# Patient Record
Sex: Male | Born: 1990 | Race: Black or African American | Hispanic: No | Marital: Single | Smoking: Never smoker
Health system: Southern US, Community
[De-identification: ages and names within clinical notes are randomized; demographics above are authoritative.]

---
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
## 2012-11-03 ENCOUNTER — Inpatient Hospital Stay: Payer: Self-pay | Admitting: Psychiatry

## 2012-11-03 LAB — COMPREHENSIVE METABOLIC PANEL
Albumin: 4.2 g/dL (ref 3.4–5.0)
Anion Gap: 7 (ref 7–16)
BUN: 23 mg/dL — ABNORMAL HIGH (ref 7–18)
Bilirubin,Total: 0.2 mg/dL (ref 0.2–1.0)
Calcium, Total: 9.6 mg/dL (ref 8.5–10.1)
Co2: 26 mmol/L (ref 21–32)
Glucose: 76 mg/dL (ref 65–99)
SGOT(AST): 29 U/L (ref 15–37)
SGPT (ALT): 26 U/L (ref 12–78)
Sodium: 138 mmol/L (ref 136–145)

## 2012-11-03 LAB — CBC
HCT: 51.6 % (ref 40.0–52.0)
MCH: 26.7 pg (ref 26.0–34.0)
MCHC: 33.7 g/dL (ref 32.0–36.0)
MCV: 79 fL — ABNORMAL LOW (ref 80–100)
Platelet: 225 10*3/uL (ref 150–440)

## 2012-11-03 LAB — URINALYSIS, COMPLETE
Bilirubin,UR: NEGATIVE
Blood: NEGATIVE
Glucose,UR: NEGATIVE mg/dL (ref 0–75)
Leukocyte Esterase: NEGATIVE
Ph: 6 (ref 4.5–8.0)
RBC,UR: 1 /HPF (ref 0–5)
Specific Gravity: 1.03 (ref 1.003–1.030)
WBC UR: 1 /HPF (ref 0–5)

## 2012-11-03 LAB — DRUG SCREEN, URINE
Amphetamines, Ur Screen: NEGATIVE (ref ?–1000)
Barbiturates, Ur Screen: NEGATIVE (ref ?–200)
Benzodiazepine, Ur Scrn: NEGATIVE (ref ?–200)
Cannabinoid 50 Ng, Ur ~~LOC~~: NEGATIVE (ref ?–50)
MDMA (Ecstasy)Ur Screen: NEGATIVE (ref ?–500)
Opiate, Ur Screen: NEGATIVE (ref ?–300)
Tricyclic, Ur Screen: NEGATIVE (ref ?–1000)

## 2012-11-03 LAB — ETHANOL: Ethanol: <3 mg/dL

## 2012-11-04 LAB — BEHAVIORAL MEDICINE 1 PANEL
Albumin: 3.5 g/dL (ref 3.4–5.0)
Anion Gap: 3 — ABNORMAL LOW (ref 7–16)
Basophil %: 0.6 %
Eosinophil #: 0.2 10*3/uL (ref 0.0–0.7)
Glucose: 87 mg/dL (ref 65–99)
HCT: 48.9 % (ref 40.0–52.0)
Lymphocyte #: 2 10*3/uL (ref 1.0–3.6)
Lymphocyte %: 34.3 %
MCH: 26.6 pg (ref 26.0–34.0)
Monocyte #: 0.4 x10 3/mm (ref 0.2–1.0)
Potassium: 4.2 mmol/L (ref 3.5–5.1)
RDW: 13.9 % (ref 11.5–14.5)
SGOT(AST): 24 U/L (ref 15–37)
Sodium: 143 mmol/L (ref 136–145)
Thyroid Stimulating Horm: 0.44 u[IU]/mL — ABNORMAL LOW
WBC: 5.8 10*3/uL (ref 3.8–10.6)

## 2014-08-18 NOTE — Discharge Summary (Signed)
PATIENT NAME:  Ivan Norris, Cloud A MR#:  [id number] DATE OF BIRTH:  [birth year]  DATE OF ADMISSION:  11/03/2012 DATE OF DISCHARGE:    HISTORY OF PRESENT ILLNESS: [name] is a 24 year old African American male, who was referred to the hospital by his psychiatrist. He reported that he has been depressed for a long time and he had told his psychiatrist that he was thinking about getting a gun and shooting himself. He reported poor sleep with poor energy for many months. He also had reported seeing shadows out of the corners of his eyes. He denied any auditory or visual hallucinations on admission.   HOSPITAL COURSE: The patient was admitted on regular precautions. He was started on Prozac at 20 mg once daily to address his mood symptoms. The patient was initially very withdrawn and isolated, but he was compliant with his treatment. During the course of his hospital stay, the patient became more interactive with his peers and attended groups. He was compliant with all the unit activities and interacted appropriately with his peers. A counseling session was also held with his physician. The patient endorsed that he has been more socially isolative and realizes he needs to make an effort to be more social. He discussed his future plans and stated that he was obtaining better clarity about what he needs to do. By the 15th, the patient had significantly improved mood. He denied any suicidal or homicidal ideations. He was quite stable and ready for discharge by the 15th.   PAST MEDICAL HISTORY: The patient does not have any medical illnesses.   HISTORY OF MENTAL ILLNESS IN THE FAMILY: There is no history of mental illness.  SOCIAL HISTORY:  The patient works at a job at [employer] and lives with his mother, sister and brother.  LEGAL HISTORY: He denied.  MENTAL STATUS EXAM AT DISCHARGE: The patient presented with much improved affect. He was smiling. Speech was still soft, but normal in rate. Mood is improved. Affect  is smiling. Thinking was organized and logical. Denied any suicidal or homicidal ideations. He denied any auditory or visual hallucinations. He had fair insight and judgment.   DIAGNOSES: AXIS I: Major depressive disorder. AXIS II: None. AXIS III: None. AXIS IV: Social issues. AXIS V: GAF of 75.  DISPOSITION/PLAN: Continue Prozac at 20 mg once daily. Follow up with the psychiatrist in weekly therapy sessions. Encouraged to make efforts to be more social. He will be discharged to the care of his mother with whom he lives.  ____________________________ Patrick NorthHimabindu Jevon Shells, MD hr:aw D: 11/09/2012 10:40:18 ET T: 11/09/2012 10:49:53 ET JOB#: [id number]  cc: Patrick NorthHimabindu Vihaan Gloss, MD, <Dictator> Patrick NorthHIMABINDU Evalyse Stroope MD ELECTRONICALLY SIGNED 11/17/2012 10:45

## 2014-08-18 NOTE — Consult Note (Signed)
PATIENT NAME:  Ivan Norris, Ivan Norris MR#:  [id number] DATE OF BIRTH:  September 27, 1990  DATE OF CONSULTATION:  11/03/2012  CONSULTING PHYSICIAN:  Izola PriceFrances C. Jaclynn MajorGreason, MD  CHIEF COMPLAINT: "My MD suggested I need to go to the ER.  [facility], but somehow I ended up here."   HISTORY OF PRESENT ILLNESS: Ivan Norris reports that he has been depressed for about Norris year. When I ask him about that, he reports that he has low self-esteem, he is not confident, everything he tries he fails at. He goes on to say, "I'm an outsider, I don't connect well with others. The only friends I have are on Facebook, and they only last Norris month or two."   He reports that he is thinking about getting Norris gun and shooting himself.   He goes on to say that he does not do anything anymore with his friends or any activities that he once enjoyed. He reports variable sleep with nightmares. He has no energy. He reports that he cannot keep anything in his brain. He is thinking more specifically and frequently about buying Norris gun.   He talks about seeing shadows out of the corners of his eyes when he is at work. He has periods of time when he thinks, "My life should be over."   He reports that this morning when he awakened he was feeling more depressed than usual, and that is the reason that he called his psychiatrist.   On interview, he appears apathetic, distant, isolative. He has complaints of lost interest, sleep change and suicidality. He is anhedonic. He denies delusions and none are noted. He does complain of seeing shadows out of the corners of his eyes at times. He cannot explain further.  He does not recognize them nor does he know their shapes. He is very still. He denies anxiety or panic attacks. His behavior is apathetic. His consciousness is intact. and he is attentive and appears able to concentrate on the conversation. First treatment, 2 months ago outpatient treatment.  No previous hospitalization or history of  suicide attempts. He is treated by Dr. Derrek GuSloan  [place], whom he describes as "my regular, MD." He reports that Dr. Kathrynn RunningManning has diagnosed him with depression and ADD, and for Norris month or two he has been taking sertraline 100 mg p.o. daily and Strattera 25 mg t.i.d.  He reports that he has noted no difference. He denies any use of substances, and his urine drug screen is not complete. He denies any use of alcohol.  HISTORY OF MENTAL ILLNESS IN THE FAMILY: He denies.   SOCIAL HISTORY: Ivan Norris is Norris 24 year old who works at Norris job, [employer], and lives with his mother, sister and brother.   SOCIAL HISTORY: He reports no social life. He says he has no friends.   HISTORY OF PRIOR TRAUMA OR ABUSE: He denies.  LEGAL HISTORY:  He denies.   MENTAL STATUS EXAM: His speech is slowed but fluent, and he is able to describe things in detail for the most part if he is pressed. His mood is depressed. He also appears isolative. His affect is flat. His thought processes are linear, logical and goal directed. His thought content is positive for suicidal ideation and plan and intent. It is devoid of homicidal ideation, auditory or visual hallucinations or delusions. He is oriented x 4.  His attention is alert, awake and oriented. His concentration is fair.  His memory appears to be intact. Fund of knowledge is fair. His language is fair. His judgment is fair-to-poor and his insight is poor.   REVIEW OF SYSTEMS: Noncontributory.   DIAGNOSIS:  PRINCIPAL AND PRIMARY:  AXIS I:  Depressive disorder, not otherwise specified.   ADDITIONAL DIAGNOSES:   AXIS I:  Attention deficit disorder.     AXIS II: Deferred.  AXIS III: Deferred.   AXIS IV:  Severe--problems with primary support group, social environment.   AXIS V: 45.      TREATMENT PLAN: Admit to [facility].   ____________________________ Izola Price. Jaclynn Major, MD fcg:cb D: 11/03/2012 14:36:47  ET T: 11/03/2012 16:04:17 ET JOB#: [id number]  cc: Izola Price. Jaclynn Major, MD, <Dictator> Maryan Puls MD ELECTRONICALLY SIGNED 11/03/2012 16:42

## 2014-09-05 NOTE — H&P (Signed)
Psychiatric Admission Assessment Adult Patient Identification:  24 year-old male Date of Evaluation:  11/04/2012 Chief Complaint:  "depressed, life not worth living,was sent to ER by his MD" History of Present Illness (8 essential elements): Patient is a 24 yo AAM who reports that he has been depressed for about a year. Patient states he has not been enjoying his life, not enjoying any activities. Has not been sleeping well, has nightmares. Reports being frustrated at his life, feels he fails at everything, does not have any friends. Difficuklty doing things, requiring increased effort, gets tired easily.reports that he is thinking about getting a gun and shooting himself.  reports that this morning when he awakened he was feeling more depressed than usual, and that is the reason that he called his psychiatrist.  visual hallucinations of seeing shadows at the corner of his eyes.He denies anxiety or panic attacks. His behavior is apathetic. First treatment, 2 months ago outpatient treatment.  No previous hospitalization or history of suicide attempts. He is treated by Dr. Derrek GuSloan  Manning [place]. He reports that Dr. Kathrynn RunningManning has diagnosed him with depression and ADD, and for a month or two he has been taking sertraline 100 mg p.o. daily and Strattera 25 mg t.i.d.  He reports that he has noted no difference. He denies any use of substances, and his urine drug screen is negative for any substances.. He denies any use of alcohol.   Associated Signs/SymptomsSymptoms:  depressed mood, suicidal thoughts, low energy, guilt (Hypo) Manic Symptoms:  poor sleep Anxiety Symptoms:  increased anxiety Psychotic Symptoms: visual hallucinations PTSD Symptoms:  Denies  Psychiatric Specialty Exam: Physical Exam:  Completed in ED, reviewed, stable  ROS:   CONSTITUTIONAL: No weight loss, fever, chills, weakness or fatigue. HEENT: Eyes: No diplopia or blurred vision. ENT: No earache, sore throat or  runny nose.  CARDIOVASCULAR: No pressure, squeezing, strangling, tightness, heaviness or aching about the chest, neck, axilla or epigastrium.  RESPIRATORY: No cough, shortness of breath, dyspnea or orthopnea.  GASTROINTESTINAL: No nausea, vomiting or diarrhea.  GENITOURINARY: No dysuria, frequency or urgency.  MUSCULOSKELETAL: No muscle or joint pain, stiffness.  SKIN: No change in skin, hair or nails.  NEURO: No paresthesias, fasciculations, seizures or weakness.  PSYCHIATRIC: depressed mood ENDOCRINE: No heat or cold intolerance, polyuria or polydipsia.  HEMATOLOGICAL: No easy bruising or bleeding.   Vital Signs:  Blood pressure 131/81, pulse 85, temperature 98.3 degrees F oral, respiratory rate 18  General Appearance:  Neat  Eye Contact:  Fair  Speech:  Normal rate  Volume:  Normal  Mood:  Agitated  Affect:  Flat  Thought Process:  organized  Orientation:  Alert and oriented x 3  Thought Content:  Depressed, negative  Suicidal Thoughts: feels like he is better of dead  Homicidal Thoughts:  Denies  Memory:  fair  Judgment:  fair  Insight:  shallow  Psychomotor Activity:  normal  Concentration: normal  Recall:  normal  Akathisia:  None  Handed:  Right  AIMS (if indicated):    Assets:  Resilience, physical health, social support  Sleep:  0 hours   Past Psychiatric History: Diagnosis:  MDD    Outpatient Care:  Dr.Manning  Substance Abuse Care:  None  Self-Mutilation:  None  Suicidal Attempts:  Denies  Violent Behaviors:  Denies   Past Medical History:  None (denies)  Past Medical (concussions, head injury, cardiac issues with apnea episode):  None  Allergies:  None  PTA Medications:  None  Previous Psychotropic Medications:  None  Medication/Dose:  None           Substance Abuse History in the last 12 months:  denies  Consequences of Substance Abuse:  None  Social History:  Lives with mother, sister and brother, employed Additional Social  History: Marital Status:  single Education:  high school History of Abuse (Emotional/Physical/Sexual):  Denies Hotel managerMilitary History:  None  Family History:  Denies  No results found for this or any previous visit (from the past 72 hours).Evaluations Assessment:I:  MDD with psychotic symptomsII:  DeferredIII:  Denies AXIS IV: severe depression AXIS V:  35  Treatment Plan/Recommendations:  Review of chart, vital signs, medications, and notes. 1. Admit for crisis management and stabilization; estimated length of stay 5-7 days.Individual and group therapy encouraged.Medication management for current depression.Coping skills for depression.Continue crisis stabilization and management.Address health issues:  7. Treatment plan in progress to prevent relapse of depression and anxiety.Psychosocial education regarding relapse prevention and self-care.Health care follow-up for any health concerns that may arise.Call for consult with hospitalist for addtional specialty patient services as needed.Patient able to contract for safety in the hospital.  Current Medications:  none, was taking Zoloft 100mg , strattera 25mg  tid, not helpful  Observation Level/Precautions:  Every 15 minute checks  Laboratory:  Completed and reviewed, stable  Psychotherapy:  Individual and group therapy  Medications:  Management  Consultations:  None  Discharge Concerns:  safety and stabilization  Estimated LOS:  5-7 days  Other:  certify that inpatient services furnished can reasonably be expected to improve the patient's condition.  Hima Aleja Yearwood,MD  Electronic Signatures: Patrick Northavi, Knute Mazzuca (MD)  (Signed on 10-Jul-14 13:58)  Authored  Last Updated: 10-Jul-14 13:58 by Patrick Northavi, Izaiah Tabb (MD)

## 2021-10-04 ENCOUNTER — Encounter (HOSPITAL_COMMUNITY): Payer: Self-pay | Admitting: Student

## 2021-10-04 ENCOUNTER — Ambulatory Visit (HOSPITAL_COMMUNITY)
Admission: EM | Admit: 2021-10-04 | Discharge: 2021-10-05 | Disposition: A | Discharge status: Home / Self Care | Payer: No Typology Code available for payment source | Attending: Psychiatry | Admitting: Psychiatry

## 2021-10-04 DIAGNOSIS — Z79899 Other long term (current) drug therapy: Secondary | ICD-10-CM | POA: Diagnosis not present

## 2021-10-04 DIAGNOSIS — Z20822 Contact with and (suspected) exposure to covid-19: Secondary | ICD-10-CM | POA: Diagnosis not present

## 2021-10-04 DIAGNOSIS — F332 Major depressive disorder, recurrent severe without psychotic features: Secondary | ICD-10-CM

## 2021-10-04 DIAGNOSIS — Z9151 Personal history of suicidal behavior: Secondary | ICD-10-CM | POA: Diagnosis not present

## 2021-10-04 LAB — COMPREHENSIVE METABOLIC PANEL
ALT: 19 U/L (ref 0–44)
AST: 33 U/L (ref 15–41)
Albumin: 4.5 g/dL (ref 3.5–5.0)
Alkaline Phosphatase: 135 U/L — ABNORMAL HIGH (ref 38–126)
Anion gap: 13 (ref 5–15)
BUN: 23 mg/dL — ABNORMAL HIGH (ref 6–20)
CO2: 22 mmol/L (ref 22–32)
Calcium: 9.5 mg/dL (ref 8.9–10.3)
Chloride: 102 mmol/L (ref 98–111)
Creatinine, Ser: 1.29 mg/dL — ABNORMAL HIGH (ref 0.61–1.24)
GFR, Estimated: >60 mL/min (ref >60)
Glucose, Bld: 58 mg/dL — ABNORMAL LOW (ref 70–99)
Potassium: 4.2 mmol/L (ref 3.5–5.1)
Sodium: 137 mmol/L (ref 135–145)
Total Bilirubin: 1.9 mg/dL — ABNORMAL HIGH (ref 0.3–1.2)
Total Protein: 8.1 g/dL (ref 6.5–8.1)

## 2021-10-04 LAB — HEMOGLOBIN A1C
Hgb A1c MFr Bld: 5.3 % (ref 4.8–5.6)
Mean Plasma Glucose: 105.41 mg/dL

## 2021-10-04 LAB — POCT URINE DRUG SCREEN - MANUAL ENTRY (I-SCREEN)
POC Amphetamine UR: NOT DETECTED
POC Buprenorphine (BUP): NOT DETECTED
POC Cocaine UR: NOT DETECTED
POC Marijuana UR: NOT DETECTED
POC Methadone UR: NOT DETECTED
POC Methamphetamine UR: NOT DETECTED
POC Morphine: NOT DETECTED
POC Oxazepam (BZO): NOT DETECTED
POC Oxycodone UR: NOT DETECTED
POC Secobarbital (BAR): NOT DETECTED

## 2021-10-04 LAB — CBC WITH DIFFERENTIAL/PLATELET
Abs Immature Granulocytes: 0.01 10*3/uL (ref 0.00–0.07)
Basophils Absolute: 0 10*3/uL (ref 0.0–0.1)
Basophils Relative: 0 %
Eosinophils Absolute: 0 10*3/uL (ref 0.0–0.5)
Eosinophils Relative: 0 %
HCT: 50.9 % (ref 39.0–52.0)
Hemoglobin: 16.4 g/dL (ref 13.0–17.0)
Immature Granulocytes: 0 %
Lymphocytes Relative: 32 %
Lymphs Abs: 1.9 10*3/uL (ref 0.7–4.0)
MCH: 26 pg (ref 26.0–34.0)
MCHC: 32.2 g/dL (ref 30.0–36.0)
MCV: 80.8 fL (ref 80.0–100.0)
Monocytes Absolute: 0.3 10*3/uL (ref 0.1–1.0)
Monocytes Relative: 6 %
Neutro Abs: 3.7 10*3/uL (ref 1.7–7.7)
Neutrophils Relative %: 62 %
Platelets: 254 10*3/uL (ref 150–400)
RBC: 6.3 MIL/uL — ABNORMAL HIGH (ref 4.22–5.81)
RDW: 13.7 % (ref 11.5–15.5)
WBC: 5.9 10*3/uL (ref 4.0–10.5)
nRBC: 0 % (ref 0.0–0.2)

## 2021-10-04 LAB — RESP PANEL BY RT-PCR (FLU A&B, COVID) ARPGX2
Influenza A by PCR: NEGATIVE
Influenza B by PCR: NEGATIVE
SARS Coronavirus 2 by RT PCR: NEGATIVE

## 2021-10-04 LAB — TSH: TSH: 0.402 u[IU]/mL (ref 0.350–4.500)

## 2021-10-04 LAB — URINALYSIS, ROUTINE W REFLEX MICROSCOPIC
Bilirubin Urine: NEGATIVE
Glucose, UA: NEGATIVE mg/dL
Hgb urine dipstick: NEGATIVE
Ketones, ur: 80 mg/dL — AB
Leukocytes,Ua: NEGATIVE
Nitrite: NEGATIVE
Protein, ur: NEGATIVE mg/dL
Specific Gravity, Urine: 1.033 — ABNORMAL HIGH (ref 1.005–1.030)
pH: 5 (ref 5.0–8.0)

## 2021-10-04 LAB — POC SARS CORONAVIRUS 2 AG: SARSCOV2ONAVIRUS 2 AG: NEGATIVE

## 2021-10-04 MED ORDER — TRAZODONE HCL 50 MG PO TABS: 50.0000 mg | ORAL_TABLET | Freq: Every evening | ORAL | Status: DC | PRN

## 2021-10-04 MED ORDER — ACETAMINOPHEN 325 MG PO TABS: 650.0000 mg | ORAL_TABLET | Freq: Four times a day (QID) | ORAL | Status: DC | PRN

## 2021-10-04 MED ORDER — ALUM & MAG HYDROXIDE-SIMETH 200-200-20 MG/5ML PO SUSP: 30.0000 mL | ORAL | Status: DC | PRN

## 2021-10-04 MED ORDER — FLUOXETINE HCL 10 MG PO CAPS
10.0000 mg | ORAL_CAPSULE | Freq: Every day | ORAL | Status: DC
  Administered 2021-10-04 – 2021-10-05 (×2): 10 mg via ORAL
  Filled 2021-10-04 (×3): qty 1

## 2021-10-04 MED ORDER — MAGNESIUM HYDROXIDE 400 MG/5ML PO SUSP: 30.0000 mL | Freq: Every day | ORAL | Status: DC | PRN

## 2021-10-04 NOTE — ED Notes (Signed)
Pt laying in bed calm and cooperative. No c/o of SI/HI or pain. Will continue to monitor for safety

## 2021-10-04 NOTE — ED Notes (Signed)
Pt sleeping@this time. Breathing even and unlabored. Will continue to monitor for safety 

## 2021-10-04 NOTE — Progress Notes (Signed)
Received Ivan Norris on the OBS unit and assisted with the skin assessment. He was oriented to his new environment and offered nourishments. He is pleasant and denied feeling suicidal, anxious and depressed at the present time. He stated his goal was to start taking medications and follow up therapy. He is currently watch a comedy on the TV.

## 2021-10-04 NOTE — Progress Notes (Signed)
   10/04/21 1020  [facility] Triage Screening (Walk-ins at [facility] only)  How Did You Hear About [place]? Family/Friend  What Is the Reason for Your Visit/Call Today? 31 year old present to [facility] with depressive symptoms. Report dealing with chronic depression past 10 years. Current depressive symptoms lack of motivation, low energy, lack of focus, moving slowly, lack of socialization skills, sleep varies (report overslept on the weekends) and lack appetite (report did not anything yesterday just drank coffee and water). Report lack of motivation is affecting is work (not making good sound decision at work) and lack of focus. Report suicidal ideations with no plan (last SI this morning). Suicidal intent age 66, denied homicidal, denied auditory/visual hallucinations. Denied substance use, report tired an elibigible for the 1st time last week. Denied bein on medication and denied therapy.  How Long Has This Been Causing You Problems? 1 wk - 1 month  Have You Recently Had Any Thoughts About Hurting Yourself? No  Are You Planning to Commit Suicide/Harm Yourself At This time? No  Have you Recently Had Thoughts About Hurting Someone Karolee Ohs? No  Are You Planning To Harm Someone At This Time? No  Are you currently experiencing any auditory, visual or other hallucinations? No  Have You Used Any Alcohol or Drugs in the Past 24 Hours? No  Do you have any current medical co-morbidities that require immediate attention? No  Clinician description of patient physical appearance/behavior: appropriate for weather  What Do You Feel Would Help You the Most Today? Stress Management  If access to [facility] was not available, would you have sought care in the Emergency Department? Yes  Determination of Need Routine (7 days)  Options For Referral Medication Management;Outpatient Therapy

## 2021-10-04 NOTE — ED Notes (Signed)
Meal provided 

## 2021-10-04 NOTE — ED Provider Notes (Signed)
[facility] Continuous Assessment Admission H&P  Date: 10/04/21 Patient Name: Ivan Norris MRN: [id number] Chief Complaint:  Chief Complaint  Patient presents with   Depression    31 year [facility]. Report dealing with chronic depression past 10 years. Current depressive symptoms lack of motivation, low energy, lack of focus, moving slowly, lack of socialization skills, sleep varies (report overslept on the weekends) and lack appetite (report did not anything yesterday just drank coffee and water). Report suicidal ideations with no plan (last SI this morning). Suicidal intent age 31, denied homicidal, denied auditory/visual hallucinations.       Diagnoses:  Final diagnoses:  Severe episode of recurrent major depressive disorder, without psychotic features (HCC)    HPI:  Ivan Norris is a 31 y.o. male [facility] SI after mom found out patient had decided not to go to work 10/04/2021 AM. Mom left work and [facility]. Stated that he's had increased frequency of active SI for a few weeks where he pictures himself cutting his wrist with a knife or shooting himself multiple times a day, with the last time this AM.  Depression had been worsening over past few weeks, with feeling of hopelessness, no motivation, decreased appetite, poor sleep, feeling like people and family would be better off if he were dead. Stressors are current job, anxiety about maintaining employment and finances since recent move in Feb 2023. Patient owns riffles x2 with ammunition for one in the home. [facility], mom had to convinced patient to give guns to grandma for keeping, but patient tried to put the guns away himself. Patient has had 1 suicide attempt and psych hospital admission about 7 years ago, attempt by cutting with new steak knife. Currently has no outpatient psych services or therapy or PCP, but has insurance now  from work.  Patient inially adamantly refused observation overnight until speaking with mom who convinced him to stay 1 night voluntarily. Patient gave consent to talk to mom, mainly to ask questions, but not disclose his SI. Patient is amenable to meds and php. Stating php soon is a good option because he was considering going back to trade school and finding a new job.   Collateral with mom in person:  Mom hardly sees patient due to conflicting work schedule, stating she pretty much only sees him through home camera. Otherwise, patient is isolated. Reported patient has been out of character and very depressed for last few weeks, similar to his last suicide attempt. Mom stated that she has provided multiple psych resources to patient and offered [address] suicide hotline, to which mom stated patient seems disinterested. Mom says patient is minimizing depression and is concern for his safety. Mom does not want to IVC patient for worry of it showing up on his record.   PHQ 2-9:     Total Time spent with patient: 1 hour  Musculoskeletal  Strength & Muscle Tone: within normal limits Gait & Station: normal Patient leans: N/A  Psychiatric Specialty Exam  Presentation General Appearance: Casual; Appropriate for Environment  Eye Contact:Minimal  Speech:Clear and Coherent; Normal Rate  Speech Volume:Normal  Handedness:Right   Mood and Affect  Mood:Depressed; Worthless; Hopeless  Affect:Restricted   Thought Process  Thought Processes:Coherent; Goal Directed  Descriptions of Associations:Circumstantial  Orientation:Full (Time, Place and Person)  Thought Content:Perseveration; Rumination (Ruminiates on depressed mood. Dissmissive of his frequent SI where he has recurrent thoughts  of cutting himself or shooting himself.)    Hallucinations:Hallucinations: None  Ideas of Reference:None  Suicidal Thoughts:Suicidal Thoughts: Yes, Active SI Active Intent and/or Plan: With Access to Means;  With Plan (Knife and cutting)  Homicidal Thoughts:Homicidal Thoughts: No   Sensorium  Memory:Immediate Good; Recent Good; Remote Good  Judgment:Fair (Adamantly refusing to be admitted for observation to be started on medications to bridge until outpatient appointment despite no work or obligations tomorrow.)  Insight:Shallow (Minimizing sxs)   Executive Functions  Concentration:Good  Attention Span:Good  Recall:Good  Progress Energy of Knowledge:Good  Language:Good   Psychomotor Activity  Psychomotor Activity:Psychomotor Activity: Psychomotor Retardation   Assets  Assets:Housing; Health and safety inspector; Physical Health; Social Support   Sleep  Sleep:Sleep: Poor (hypersomnia, early morning waking)   No data recorded  Physical Exam ROS  Blood pressure 127/73, pulse 61, temperature 98 F (36.7 C), temperature source Oral, resp. rate 18, SpO2 100 %. There is no height or weight on file to calculate BMI.  Past Psychiatric History:  Suicide attempt by cutting in early 71s Psych hospitalization x1 for SA per above Tried zoloft in past, caused SI. Tried prozac, helpful, but stopped due to out of meds and lack of time to get refills No OP psychiatrist or therapist No current psychotrops  PFH:  Maternal grandfather and mom's uncle committed suicide. Mom has depression on medications. No h/o bipolar d/o or scz No substance abuse   Substance use hx EtOH rarely, cannot remember last time drank. Denied withdrawal hx and sz hx Denied tobacco products Intermittent THC edibles, does not enjoy Denied IVDU, illicit substances, and prescription rx misuse   Is the patient at risk to self? Yes  Has the patient been a risk to self in the past 6 months? No .    Has the patient been a risk to self within the distant past? Yes   Is the patient a risk to others? No   Has the patient been a risk to others in the past 6 months? No   Has the patient been a risk to others within the  distant past? No   Past Medical History: History reviewed. No pertinent past medical history. History reviewed. No pertinent surgical history.  Family History: History reviewed. No pertinent family history.  Social History:  Social History   Socioeconomic History   Marital status: Single    Spouse name: Not on file   Number of children: Not on file   Years of education: Not on file   Highest education level: Not on file  Occupational History   Not on file  Tobacco Use   Smoking status: Not on file   Smokeless tobacco: Not on file  Substance and Sexual Activity   Alcohol use: Not on file   Drug use: Not on file   Sexual activity: Not on file  Other Topics Concern   Not on file  Social History Narrative   Not on file   Social Determinants of Health   Financial Resource Strain: Not on file  Food Insecurity: Not on file  Transportation Needs: Not on file  Physical Activity: Not on file  Stress: Not on file  Social Connections: Not on file  Intimate Partner Violence: Not on file    SDOH:  SDOH Screenings   Alcohol Screen: Not on file  Depression (HAL9-3): Not on file  Financial Resource Strain: Not on file  Food Insecurity: Not on file  Housing: Not on file  Physical Activity: Not on file  Social  Connections: Not on file  Stress: Not on file  Tobacco Use: Not on file  Transportation Needs: Not on file    Last Labs:  No visits with results within 6 Month(s) from this visit.  Latest known visit with results is:  Orders Only on 06/05/2010  Component Date Value Ref Range Status   TSH 06/05/2010 1.12  0.35 - 5.50 uIU/mL Final   IgA 06/05/2010 186  68 - 378 mg/dL Final        Allergies: Patient has no known allergies.  PTA Medications: (Not in a hospital admission)   Medical Decision Making  Active SI 2/2 severe MDD Concerning suicide risk factors per above. Mom has safety concerns for patient and convinced him to stay overnight in obs. Prozac worked well  for patient in the past, will restart. Also appreciated CSW with assistance with resources. Admission to obs overnight  Prozac 10 mg daily Psych med w/u and baseline labs Voluntary stay    Recommendations  Based on my evaluation the patient does not appear to have an emergency medical condition.  Princess Bruins, DO 10/04/21  2:45 PM

## 2021-10-04 NOTE — ED Notes (Signed)
[facility] called to collect STAT specimens and to deliver to [facility]. 

## 2021-10-04 NOTE — BH Assessment (Signed)
LCSW Progress Note  Per Estella Husk, MD-, this pt does not require psychiatric hospitalization at this time.  Pt is psychiatrically cleared.  Discharge instructions include several resources for outpatient psychiatric and therapy services along with the mobile crisis number.  Estella Husk, MD, has been notified.  Hansel Starling, MSW, LCSW [facility] [phone] or [phone]

## 2021-10-04 NOTE — Discharge Instructions (Addendum)
Dear Ivan Norris,   It has been a pleasure meeting you. I am grateful that you and your mom came in for resources and allowed me and my team to help. I wish you and your family health and happiness.  Best, Dr. Cyndie Chime  Comparing medication prices amongst pharmacies and formulations: GoodRx You can also contact: [facility] [address] [place], [place], [address] [phone] phone  Please ensure you make a PCP appointment and psychiatrist appointment as soon as possible to assist in medications. Remember telehealth is also an option.   At your PCP, please talk to them about your kidney. Cr 1.29. BUN 23. Please get blood drawn in 5 days. Please do not worry, but drink PLENTY of fluids, this is most likely dehydration and they WILL heal if you hydrate.   Cognitive Behavioral Therapy (CBT): While waiting for appointment with therapy, consider books, audiobooks, videos, phone apps, about this.   Insomnia: CBT-I app - originally made for veterans    You will need to call to see if they accept your insurance, but they may have comparable prices. They also offer vaccines and immunizations on-site.  Base on observation and collateral information, outpatient psychiatry and therapy services have been recommended.  It is imperative to your mental health that seek out services 7-10 days after the day of discharge to prevent another crisis. A list of referrals have been provided below based on your needs and recommendations.  You are not limited to the list provided.  In case of an urgent crisis, you may contact the Mobile Crisis Unit with [facility] at [phone].   [facility] [employer] [place], [place], [address] [phone] phone  Call the number above or go to the clinic to inquire about the virtual Partial Hospitalization Program.  This is a more intense type of outpatient program that can be done from home or anywhere  else since it is virtual.  This same clinic also offers other outpatient services for therapy, life skills, and medication management.       [facility]      [address]      [place], [place], [address]      [phone] phone      [phone] fax       [facility]      [address]      [place], [place] [address]      [phone]       [facility]      [address], [address]      [place], [place] [address]      [phone]       [facility]      [address] [place]      [place], [place] [address]      [phone]       [facility]      [address], [address]      [place], [place] [address]      [phone]       [facility]      [address]      [place], [place] [address]      [phone]      Archer Asa, MD specializes in geropsych)

## 2021-10-05 DIAGNOSIS — F332 Major depressive disorder, recurrent severe without psychotic features: Secondary | ICD-10-CM | POA: Diagnosis not present

## 2021-10-05 MED ORDER — FLUOXETINE HCL 20 MG PO CAPS: 20.0000 mg | ORAL_CAPSULE | Freq: Every day | ORAL | 0 refills | Status: DC

## 2021-10-05 MED ORDER — FLUOXETINE HCL 10 MG PO CAPS
10.0000 mg | ORAL_CAPSULE | Freq: Once | ORAL | Status: AC
  Administered 2021-10-05: 10 mg via ORAL

## 2021-10-05 NOTE — ED Notes (Signed)
Pt was given a muffin and juice for breakfast.  

## 2021-10-05 NOTE — ED Notes (Signed)
Pt has not yet seen the provider and is wanting to know when they will come see him. This nurse advised the patient I will reach out to see if there is an ETA that can be provided

## 2021-10-05 NOTE — ED Notes (Signed)
Pt wanted to speak with provider to see what the next step would be for him. This nurse advised the patient that the providers will start making rounds around 830, and they will speak with him and from there discussion the next plan of action will be determined and discussed with him.

## 2021-10-05 NOTE — ED Provider Notes (Addendum)
FBC/OBS ASAP Discharge Summary  Date and Time: 10/05/2021 4:15 PM  Name: Ivan Norris  MRN:  [id number]   Discharge Diagnoses:  Final diagnoses:  Severe episode of recurrent major depressive disorder, without psychotic features ([place])    Subjective:  Chart review of overnight events: no acute events per nursing and on chart review. Documented that patient slept throughout night and ate all meals - dinner, breakfast. Mar showed he accepted scheduled Prozac at time 1600 on 10/04/2021.  On my assessment:  Ivan Norris is a 31 y.o. male with psychiatric history of MDD, GAD, suicide attempt and hospitalization about 7 years ago, presented to [facility] for depression and active SI after mom found out patient had decided not to go to work 10/04/2021 AM.   10/05/2021, patient was initially seen watching TV comfortably this AM. He had spoken with mom prior to evaluation.  Patient's trigger and stressor of feeling overwhelmed with searching for outpatient services and getting medication has been resolved. Stated that prozac made him feel a bit more energized yesterday, but no other side effects.  Stated that his appetite has improved last night and this AM, was able to eat dinner and breakfast.  His mood is hopeful, stated that he feels much better that he came here to get help with resources and started on prozac.  Stated that his sleep was interrupted by other patients in shared common space. But was able to fall back to sleep in less than 87mins, and reported feeling restful when he woke up. He denied SI/HI/AVH. Fully alert and oriented. Thought process and speech are organized and linear.   Patient was able to list off multiple supportive family, friends, contacts, mentors to reach out to if patient were to have suicidal thoughts again. Including [phone] and [phone]. Patient stated he feels comfortable telling mom when he has these thoughts again.   Discussed patient's Cr is elevated, likely due to dehydration.  Instructed patient to increase PO intake, at least 1L fluids daily along with regular meals.   Collateral: Discussed with mom 10/05/2021 after mom talked with patient this AM. She has no concerns for safety. Patient's guns are locked away out of the home, ammunition separated. Stated that she will get an appointment for patient with her current PCP to ensure patient does not run out of meds and to be followed up for Cr within 5 days.   Stay Summary:  Ivan Norris is a 31 y.o. male with psychiatric history of MDD, GAD, suicide attempt and hospitalization about 7 years ago, presented to [facility] for depression and active SI after mom found out patient had decided not to go to work 10/04/2021 AM.  He was admitted for observations overnight voluntarily for medical workup and initiating medications. No acute events during stay. He was started on prozac 10mg , then 20mg  the next day. Tolerated it well. Work up showed elevated Cr of 1.29 and elevated BUN, likely poor PO intake. Unclear of baseline Cr given no prior labs. Patient will follow-up with PCP for management of kidneys. Safety planning with mom completed, guns removed from home, hidden, and locked away, ammunition separated. Collateral with mom again prior to dc and mom has no safety concerns with patient returning home with her. Patient was d/c'd home to mom with 30day rx of prozac 20mg  daily with resources.   Total Time spent with patient: 30 minutes  Past Psychiatric History: Past Psychiatric History:  Suicide attempt by cutting in early 20s Psych hospitalization x1  for SA per above Tried zoloft in past, caused SI. Tried prozac, helpful, but stopped due to out of meds and lack of time to get refills No OP psychiatrist or therapist No current psychotrops   PFH:  Maternal grandfather and mom's uncle committed suicide. Mom has depression on medications. No h/o bipolar d/o or scz No substance abuse    Substance use hx EtOH rarely, cannot remember  last time drank. Denied withdrawal hx and sz hx Denied tobacco products Intermittent THC edibles, does not enjoy Denied IVDU, illicit substances, and prescription rx misuse  Past Medical History: History reviewed. No pertinent past medical history. History reviewed. No pertinent surgical history. Family History: History reviewed. No pertinent family history. Social History:  Social History   Substance and Sexual Activity  Alcohol Use None     Social History   Substance and Sexual Activity  Drug Use Not on file    Social History   Socioeconomic History   Marital status: Single    Spouse name: Not on file   Number of children: Not on file   Years of education: Not on file   Highest education level: Not on file  Occupational History   Not on file  Tobacco Use   Smoking status: Not on file   Smokeless tobacco: Not on file  Substance and Sexual Activity   Alcohol use: Not on file   Drug use: Not on file   Sexual activity: Not on file  Other Topics Concern   Not on file  Social History Narrative   Not on file   Social Determinants of Health   Financial Resource Strain: Not on file  Food Insecurity: Not on file  Transportation Needs: Not on file  Physical Activity: Not on file  Stress: Not on file  Social Connections: Not on file   SDOH:  SDOH Screenings   Alcohol Screen: Not on file  Depression (PHQ2-9): Not on file  Financial Resource Strain: Not on file  Food Insecurity: Not on file  Housing: Not on file  Physical Activity: Not on file  Social Connections: Not on file  Stress: Not on file  Tobacco Use: Not on file  Transportation Needs: Not on file    Tobacco Cessation:  N/A, patient does not currently use tobacco products  Current Medications:  Current Facility-Administered Medications  Medication Dose Route Frequency Provider Last Rate Last Admin   acetaminophen (TYLENOL) tablet 650 mg  650 mg Oral Q6H PRN Merrily Brittle, DO       alum & mag  hydroxide-simeth (MAALOX/MYLANTA) 200-200-20 MG/5ML suspension 30 mL  30 mL Oral Q4H PRN Merrily Brittle, DO       FLUoxetine (PROZAC) capsule 10 mg  10 mg Oral Daily Merrily Brittle, DO   10 mg at 10/05/21 0944   magnesium hydroxide (MILK OF MAGNESIA) suspension 30 mL  30 mL Oral Daily PRN Merrily Brittle, DO       traZODone (DESYREL) tablet 50 mg  50 mg Oral QHS PRN Merrily Brittle, DO       Current Outpatient Medications  Medication Sig Dispense Refill   [START ON 10/06/2021] FLUoxetine (PROZAC) 20 MG capsule Take 1 capsule (20 mg total) by mouth daily. 30 capsule 0    PTA Medications: (Not in a hospital admission)       No data to display          [place] ED from 10/04/2021 in [place] No Risk  Musculoskeletal  Strength & Muscle Tone: within normal limits Gait & Station: normal Patient leans: N/A  Psychiatric Specialty Exam  Presentation  General Appearance: Appropriate for Environment; Casual  Eye Contact:Good  Speech:Clear and Coherent; Normal Rate  Speech Volume:Normal  Handedness:Right   Mood and Affect  Mood:Euthymic (Hopeful)  Affect:Full Range; Congruent (joked and laughed)   Thought Process  Thought Processes:Coherent; Goal Directed; Linear  Descriptions of Associations:Intact  Orientation:Full (Time, Place and Person)  Thought Content:Logical; WDL (future oriented)     Hallucinations:Hallucinations: None  Ideas of Reference:None  Suicidal Thoughts:Suicidal Thoughts: No SI Active Intent and/or Plan: -- (denied)  Homicidal Thoughts:Homicidal Thoughts: No   Sensorium  Memory:Immediate Good; Recent Good; Remote Good  Judgment:Fair  Insight:Fair   Executive Functions  Concentration:Good  Attention Span:Good  [place] of Knowledge:Good  Language:Good   Psychomotor Activity  Psychomotor Activity:Psychomotor Activity: Normal   Assets  Assets:Communication  Skills; Desire for Improvement; Financial Resources/Insurance; Housing; Physical Health; Resilience; Social Support; Vocational/Educational   Sleep  Sleep:Sleep: Fair   No data recorded  Physical Exam  Physical Exam Vitals and nursing note reviewed.  HENT:     Head: Normocephalic and atraumatic.  Pulmonary:     Effort: Pulmonary effort is normal. No respiratory distress.  Neurological:     General: No focal deficit present.     Mental Status: He is alert.    Review of Systems  Respiratory:  Negative for shortness of breath.   Cardiovascular:  Negative for chest pain.  Gastrointestinal:  Negative for nausea and vomiting.  Neurological:  Negative for dizziness and headaches.   Blood pressure (!) 142/76, pulse 60, temperature 98.6 F (37 C), temperature source Oral, resp. rate 20, SpO2 100 %. There is no height or weight on file to calculate BMI.  Demographic Factors:  Male and Low socioeconomic status  Loss Factors: NA  Historical Factors: Prior suicide attempts, Family history of suicide, Family history of mental illness or substance abuse, and Anniversary of important loss  Risk Reduction Factors:   Sense of responsibility to family, Religious beliefs about death, Employed, Living with another person, especially a relative, Positive social support, and Positive therapeutic relationship  Continued Clinical Symptoms:  Previous Psychiatric Diagnoses and Treatments  Cognitive Features That Contribute To Risk:  Loss of executive function    Suicide Risk:  Minimal: No identifiable suicidal ideation.  Patients presenting with no risk factors but with morbid ruminations; may be classified as minimal risk based on the severity of the depressive symptoms  Plan Of Care/Follow-up recommendations:  Activity and diet as tolerated, no restrictions   Discharge Instructions      Dear Ivan Norris,   It has been a pleasure meeting you. I am grateful that you and your mom came in  for resources and allowed me and my team to help. I wish you and your family health and happiness.  Best, Dr. Alfonse Spruce  Comparing medication prices amongst pharmacies and formulations: GoodRx You can also contact: [facility] [address]. [place], [place], [address] [phone] phone  Please ensure you make a PCP appointment and psychiatrist appointment as soon as possible to assist in medications. Remember telehealth is also an option.   At your PCP, please talk to them about your kidney. Cr 1.29. BUN 23. Please get blood drawn in 5 days. Please do not worry, but drink PLENTY of fluids, this is most likely dehydration and they WILL heal if you hydrate.   Cognitive Behavioral Therapy (CBT): While waiting for  appointment with therapy, consider books, audiobooks, videos, phone apps, about this.   Insomnia: CBT-I app - originally made for veterans    You will need to call to see if they accept your insurance, but they may have comparable prices. They also offer vaccines and immunizations on-site.  Base on observation and collateral information, outpatient psychiatry and therapy services have been recommended.  It is imperative to your mental health that seek out services 7-10 days after the day of discharge to prevent another crisis. A list of referrals have been provided below based on your needs and recommendations.  You are not limited to the list provided.  In case of an urgent crisis, you may contact the Mobile Crisis Unit with [facility] at [phone].   [place] [facility]. [address] [place], [place], [address] [phone] phone  Call the number above or go to the clinic to inquire about the virtual Partial Hospitalization Program.  This is a more intense type of outpatient program that can be done from home or anywhere else since it is virtual.  This same clinic also offers other outpatient services for therapy, life  skills, and medication management.       [facility]      [address]      [place], [place], [address]      [phone] phone      [phone] fax       [place], [place], [place] [address]      [phone]       [place] [place], [place] [address]      [phone]       [place]      [place], [place] [address]      [phone]       [place]      [place] [address] [place], [place] [address]      [phone]       [facility]      [address]      [place], [place] [address]      [phone]      Norma Fredrickson, MD specializes in geropsych)       Disposition: Home with mom  Merrily Brittle, DO 10/05/2021, 4:15 PM

## 2021-10-05 NOTE — ED Notes (Signed)
Pt is lying in bed quietly watching television, no distress noted, will continue to monitor patient for safety

## 2021-10-05 NOTE — ED Notes (Signed)
Pt sleeping@this time. Breathing even and unlabored. Will continue to monitor for safety 

## 2021-10-05 NOTE — Progress Notes (Signed)
Ivan Norris was given his AVS, questions answered and he was escorted to retrieve his personal belongings. He requested to wait in the lobby for his mom.

## 2021-10-05 NOTE — ED Notes (Signed)
PT mother called to check on him. She wanted to make sure he slept well. This nurse confirmed.

## 2021-10-14 ENCOUNTER — Ambulatory Visit: Payer: No Typology Code available for payment source | Admitting: Family Medicine

## 2021-10-14 ENCOUNTER — Encounter: Payer: Self-pay | Admitting: Family Medicine

## 2021-10-14 VITALS — BP 120/62 | HR 68 | Temp 97.1°F | Ht 72.0 in | Wt 189.0 lb

## 2021-10-14 DIAGNOSIS — Z0000 Encounter for general adult medical examination without abnormal findings: Secondary | ICD-10-CM | POA: Diagnosis not present

## 2021-10-14 DIAGNOSIS — F332 Major depressive disorder, recurrent severe without psychotic features: Secondary | ICD-10-CM | POA: Diagnosis not present

## 2021-10-14 DIAGNOSIS — N179 Acute kidney failure, unspecified: Secondary | ICD-10-CM | POA: Insufficient documentation

## 2021-10-14 LAB — COMPREHENSIVE METABOLIC PANEL
ALT: 14 U/L (ref 0–53)
AST: 20 U/L (ref 0–37)
Albumin: 4.2 g/dL (ref 3.5–5.2)
Alkaline Phosphatase: 104 U/L (ref 39–117)
BUN: 18 mg/dL (ref 6–23)
CO2: 30 mEq/L (ref 19–32)
Calcium: 9.3 mg/dL (ref 8.4–10.5)
Chloride: 106 mEq/L (ref 96–112)
Creatinine, Ser: 1.11 mg/dL (ref 0.40–1.50)
GFR: 89.31 mL/min (ref >60.00)
Glucose, Bld: 84 mg/dL (ref 70–99)
Potassium: 3.9 mEq/L (ref 3.5–5.1)
Sodium: 140 mEq/L (ref 135–145)
Total Bilirubin: 0.7 mg/dL (ref 0.2–1.2)
Total Protein: 7 g/dL (ref 6.0–8.3)

## 2021-10-14 LAB — URINALYSIS, ROUTINE W REFLEX MICROSCOPIC
Bilirubin Urine: NEGATIVE
Hgb urine dipstick: NEGATIVE
Ketones, ur: NEGATIVE
Leukocytes,Ua: NEGATIVE
Nitrite: NEGATIVE
RBC / HPF: NONE SEEN (ref 0–?)
Specific Gravity, Urine: >=1.03 — AB (ref 1.000–1.030)
Total Protein, Urine: NEGATIVE
Urine Glucose: NEGATIVE
Urobilinogen, UA: 0.2 (ref 0.0–1.0)
pH: 6 (ref 5.0–8.0)

## 2021-10-14 LAB — CBC
HCT: 45.5 % (ref 39.0–52.0)
Hemoglobin: 14.7 g/dL (ref 13.0–17.0)
MCHC: 32.3 g/dL (ref 30.0–36.0)
MCV: 81.3 fl (ref 78.0–100.0)
Platelets: 206 10*3/uL (ref 150.0–400.0)
RBC: 5.6 Mil/uL (ref 4.22–5.81)
RDW: 14.2 % (ref 11.5–15.5)
WBC: 6.2 10*3/uL (ref 4.0–10.5)

## 2021-10-14 LAB — LIPID PANEL
Cholesterol: 140 mg/dL (ref 0–200)
HDL: 50.3 mg/dL (ref >39.00)
LDL Cholesterol: 82 mg/dL (ref 0–99)
NonHDL: 89.77
Total CHOL/HDL Ratio: 3
Triglycerides: 39 mg/dL (ref 0.0–149.0)
VLDL: 7.8 mg/dL (ref 0.0–40.0)

## 2021-10-14 NOTE — Progress Notes (Signed)
New Patient Office Visit  Subjective    Patient ID: Ivan Norris, male    DOB: 16-Jul-1990  Age: 31 y.o. MRN: [id number]  CC:  Chief Complaint  Patient presents with   Establish Care    New pt. Blood work. Pt fasting    HPI Ivan Norris presents to establish care Hospital discharge follow-up a few weeks ago for acute depression with suicidal ideation.  He was started on fluoxetine 20 mg.  He has taken this medication in the past with good effect.  He has done well with that over the last few weeks.  He was released and advised to follow-up with me.  BUN and creatinine were elevated during his hospitalization.  He has no history of kidney disease.  2 weeks later he is in a better place.  At this point he denies any intention of self-harm but does feel at times that it would be better if he were not here.  He works in Production designer, theatre/television/film.  He is on call every other weekend.  He has an active job and goes to kickboxing 3 times weekly.  He has a significant other who is supportive.  He lives at home with his mother.  He has not currently drinking alcohol.  He has used marijuana in the past.  He has no regular dental care.  Outpatient Encounter Medications as of 10/14/2021  Medication Sig   FLUoxetine (PROZAC) 20 MG capsule Take 1 capsule (20 mg total) by mouth daily.   No facility-administered encounter medications on file as of 10/14/2021.    History reviewed. No pertinent past medical history.  History reviewed. No pertinent surgical history.  Family History  Problem Relation Age of Onset   Diabetes Mother    Diabetes Maternal Grandmother     Social History   Socioeconomic History   Marital status: Single    Spouse name: Not on file   Number of children: Not on file   Years of education: Not on file   Highest education level: Not on file  Occupational History   Not on file  Tobacco Use   Smoking status: Never   Smokeless tobacco: Never  Vaping Use   Vaping Use: Never used   Substance and Sexual Activity   Alcohol use: Not Currently   Drug use: Not Currently   Sexual activity: Not Currently  Other Topics Concern   Not on file  Social History Narrative   Not on file   Social Determinants of Health   Financial Resource Strain: Not on file  Food Insecurity: Not on file  Transportation Needs: Not on file  Physical Activity: Not on file  Stress: Not on file  Social Connections: Not on file  Intimate Partner Violence: Not on file    Review of Systems  Constitutional: Negative.   HENT: Negative.    Eyes:  Negative for blurred vision, discharge and redness.  Respiratory: Negative.    Cardiovascular: Negative.   Gastrointestinal:  Negative for abdominal pain.  Genitourinary: Negative.   Musculoskeletal: Negative.  Negative for myalgias.  Skin:  Negative for rash.  Neurological:  Negative for tingling, loss of consciousness and weakness.  Endo/Heme/Allergies:  Negative for polydipsia.      10/14/2021   11:27 AM 10/14/2021   10:46 AM  Depression screen PHQ 2/9  Decreased Interest 1 2  Down, Depressed, Hopeless 1 2  PHQ - 2 Score 2 4  Altered sleeping 2   Tired, decreased energy 2   Change  in appetite 0   Feeling bad or failure about yourself  1   Trouble concentrating 3   Moving slowly or fidgety/restless 1   Suicidal thoughts 1   PHQ-9 Score 12   Difficult doing work/chores Somewhat difficult          Objective    BP 120/62 (BP Location: Left Arm, Patient Position: Sitting, Cuff Size: Normal)   Pulse 68   Temp (!) 97.1 F (36.2 C) (Temporal)   Ht 6' (1.829 m)   Wt 189 lb (85.7 kg)   SpO2 98%   BMI 25.63 kg/m   Physical Exam Constitutional:      General: He is not in acute distress.    Appearance: Normal appearance. He is not ill-appearing, toxic-appearing or diaphoretic.  HENT:     Head: Normocephalic and atraumatic.     Right Ear: External ear normal.     Left Ear: External ear normal.     Mouth/Throat:     Mouth:  Mucous membranes are moist.     Pharynx: Oropharynx is clear. No oropharyngeal exudate or posterior oropharyngeal erythema.  Eyes:     General: No scleral icterus.       Right eye: No discharge.        Left eye: No discharge.     Extraocular Movements: Extraocular movements intact.     Conjunctiva/sclera: Conjunctivae normal.     Pupils: Pupils are equal, round, and reactive to light.  Cardiovascular:     Rate and Rhythm: Normal rate and regular rhythm.  Pulmonary:     Effort: Pulmonary effort is normal. No respiratory distress.     Breath sounds: Normal breath sounds.  Abdominal:     General: Bowel sounds are normal.  Musculoskeletal:     Cervical back: No rigidity or tenderness.  Skin:    General: Skin is warm and dry.  Neurological:     Mental Status: He is alert and oriented to person, place, and time.  Psychiatric:        Mood and Affect: Mood normal.        Behavior: Behavior normal.         Assessment & Plan:   Problem List Items Addressed This Visit       Genitourinary   AKI (acute kidney injury) (HCC)   Relevant Orders   Comprehensive metabolic panel   Urinalysis, Routine w reflex microscopic     Other   Severe episode of recurrent major depressive disorder, without psychotic features (HCC) - Primary   Relevant Orders   Ambulatory referral to Psychology   Healthcare maintenance   Relevant Orders   CBC   Comprehensive metabolic panel   Lipid panel   Urinalysis, Routine w reflex microscopic    Return in about 4 weeks (around 11/11/2021).  Continue fluoxetine.  Referral for talking therapy.  Rechecking BUN and creatinine.  Health maintenance labs checked today.  Advised regular dental care.  Continue physical activity.  Mliss Sax, MD

## 2021-11-06 ENCOUNTER — Ambulatory Visit (INDEPENDENT_AMBULATORY_CARE_PROVIDER_SITE_OTHER): Payer: No Typology Code available for payment source | Admitting: Psychology

## 2021-11-06 DIAGNOSIS — F331 Major depressive disorder, recurrent, moderate: Secondary | ICD-10-CM | POA: Diagnosis not present

## 2021-11-06 NOTE — Progress Notes (Signed)
[facility] Counselor Initial Adult Exam  Name: Ivan Norris Date: 11/06/2021 MRN: [id number] DOB: 1990-07-17 PCP: Libby Maw, MD  Time spent: 45 mins  Guardian/Payee:  Pt   Paperwork requested: No   Reason for Visit /Presenting Problem: Pt presents for session, via webex video.  He grants consent for the session, stating that he is in his car at work during his lunch break.  I shared with pt that I am in my office at home, with no one else present here either.  Mental Status Exam: Appearance:   Casual     Behavior:  Appropriate  Motor:  Normal  Speech/Language:   Clear and Coherent  Affect:  Appropriate  Mood:  normal  Thought process:  normal  Thought content:    WNL  Sensory/Perceptual disturbances:    WNL  Orientation:  oriented to person, place, and time/date  Attention:  Good  Concentration:  Good  Memory:  WNL  Fund of knowledge:   Good  Insight:    Good  Judgment:   Good  Impulse Control:  Good   Reported Symptoms:    Risk Assessment: Danger to Self:  No Self-injurious Behavior: Yes.  without intent/plan; pt tried to cut his wrists when he was 31 yo Danger to Others: No Duty to Warn:no Physical Aggression / Violence:No  Access to Firearms a concern: No  Gang Involvement:No  Patient / guardian was educated about steps to take if suicide or homicide risk level increases between visits: n/a While future psychiatric events cannot be accurately predicted, the patient does not currently require acute inpatient psychiatric care and does not currently meet [facility] involuntary commitment criteria.  Substance Abuse History: Current substance abuse: No ; rare use of alcohol in social situations;    Past Psychiatric History:   Previous psychological history is significant for depression Outpatient Providers:Previous therapist about 8 yrs ago History of Psych Hospitalization: Yes  Psychological Testing:  none    Abuse History:   Victim of: No.,  none    Report needed: No. Victim of Neglect:No. Perpetrator of  none   Witness / Exposure to Domestic Violence: No   Protective Services Involvement: No  Witness to [employer] Violence:  No   Family History:  Family History  Problem Relation Age of Onset   Diabetes Mother    Diabetes Maternal Grandmother     Living situation: the patient lives with their family  Sexual Orientation: Straight  Relationship Status: single; dating a girl that he met online for the past month Name of spouse / other: Public affairs consultant If a parent, number of children / ages:none  Support Systems: parents (mom), grandmother, brother, former Theme park manager, friends  Financial Stress:  No   Income/Employment/Disability: Employment; [employer] in Maintenance (nine months);   Military Service: No   Educational History: Education: Museum/gallery exhibitions officer: Protestant  Any cultural differences that may affect / interfere with treatment:  not applicable   Recreation/Hobbies: Spending time with friends, kick boxing, plays chess with friends, Bible reading and devotions, some video games, watching TV  Stressors: Occupational concerns  ; trying to determine what he wants to do for a living  Strengths: Supportive Relationships, Family, Friends, Social worker, and Spirituality  Barriers:  Job issues; "battling my own mindset"   Legal History: Pending legal issue / charges: The patient has no significant history of legal issues. History of legal issue / charges:  none  Medical History/Surgical History: reviewed No past medical history on file.  No past surgical history on file.  Medications: Current Outpatient Medications  Medication Sig Dispense Refill   FLUoxetine (PROZAC) 20 MG capsule Take 1 capsule (20 mg total) by mouth daily. 30 capsule 0   No current facility-administered medications for this visit.    No Known Allergies  Diagnoses:  Moderate episode  of recurrent major depressive disorder ([place])  Plan of Care: Encouraged pt to engage in self care activities and we will meet in 3 wks for a follow up session.   Ivan Anchors, [facility]

## 2021-11-11 ENCOUNTER — Ambulatory Visit (INDEPENDENT_AMBULATORY_CARE_PROVIDER_SITE_OTHER): Payer: No Typology Code available for payment source | Admitting: Family Medicine

## 2021-11-11 ENCOUNTER — Encounter: Payer: Self-pay | Admitting: Family Medicine

## 2021-11-11 VITALS — BP 122/70 | HR 55 | Temp 97.7°F | Ht 72.0 in | Wt 185.6 lb

## 2021-11-11 DIAGNOSIS — F332 Major depressive disorder, recurrent severe without psychotic features: Secondary | ICD-10-CM

## 2021-11-11 MED ORDER — FLUOXETINE HCL 20 MG PO CAPS: 20.0000 mg | ORAL_CAPSULE | Freq: Every day | ORAL | 3 refills | Status: DC

## 2021-11-11 NOTE — Progress Notes (Signed)
Established Patient Office Visit  Subjective   Patient ID: Ivan Norris, male    DOB: [birth year]  Age: 31 y.o. MRN: [id number]  Chief Complaint  Patient presents with   Follow-up    4 week follow up, no concerns. Patient not fasting.     HPI follow-up of acute depression.  Doing well with Prozac and it seems to be helping.  Just started talking therapy.  Continues working maintenance.  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  He would like to go back to school and studying gaming development.  This will be his hearts desire.  May be difficult to go to school, work to pay for it and also pursue kickboxing.  He has decisions to make.    Review of Systems  Constitutional: Negative.   HENT: Negative.    Eyes:  Negative for blurred vision, discharge and redness.  Respiratory: Negative.    Cardiovascular: Negative.   Gastrointestinal:  Negative for abdominal pain.  Genitourinary: Negative.   Musculoskeletal: Negative.  Negative for myalgias.  Skin:  Negative for rash.  Neurological:  Negative for tingling, loss of consciousness and weakness.  Endo/Heme/Allergies:  Negative for polydipsia.  Psychiatric/Behavioral:  Negative for suicidal ideas.       11/11/2021   10:47 AM 11/11/2021   10:46 AM 10/14/2021   11:27 AM  Depression screen PHQ 2/9  Decreased Interest 0 0 1  Down, Depressed, Hopeless 0 0 1  PHQ - 2 Score 0 0 2  Altered sleeping   2  Tired, decreased energy   2  Change in appetite   0  Feeling bad or failure about yourself    1  Trouble concentrating   3  Moving slowly or fidgety/restless   1  Suicidal thoughts   1  PHQ-9 Score   12  Difficult doing work/chores Not difficult at all  Somewhat difficult       Objective:     BP 122/70 (BP Location: Left Arm, Patient Position: Sitting, Cuff Size: Normal)   Pulse (!) 55   Temp 97.7 F (36.5 C) (Temporal)   Ht 6' (1.829 m)   Wt 185 lb 9.6 oz (84.2 kg)   SpO2 97%   BMI 25.17 kg/m    Physical  Exam Constitutional:      General: He is not in acute distress.    Appearance: Normal appearance. He is normal weight. He is not ill-appearing, toxic-appearing or diaphoretic.  HENT:     Head: Normocephalic and atraumatic.     Right Ear: External ear normal.     Left Ear: External ear normal.  Eyes:     General: No scleral icterus.       Right eye: No discharge.        Left eye: No discharge.     Extraocular Movements: Extraocular movements intact.     Conjunctiva/sclera: Conjunctivae normal.  Cardiovascular:     Rate and Rhythm: Normal rate and regular rhythm.  Pulmonary:     Effort: Pulmonary effort is normal. No respiratory distress.  Skin:    General: Skin is warm and dry.  Neurological:     Mental Status: He is alert and oriented to person, place, and time.  Psychiatric:        Mood and Affect: Mood normal.        Behavior: Behavior normal.      No results found for any visits on 11/11/21.    The ASCVD  Risk score (Arnett DK, et al., 2019) failed to calculate for the following reasons:   The 2019 ASCVD risk score is only valid for ages 83 to 64    Assessment & Plan:   Problem List Items Addressed This Visit       Other   Severe episode of recurrent major depressive disorder, without psychotic features (HCC) - Primary   Relevant Medications   FLUoxetine (PROZAC) 20 MG capsule    Return in about 3 months (around 02/11/2022), or if symptoms worsen or fail to improve.  Continue Prozac.  Continue exercise for stress relief, [place] if possible.  Consider other options.  Information given on managing stress.  Continue follow-up with LCSW.  Mliss Sax, MD

## 2021-11-29 ENCOUNTER — Ambulatory Visit: Payer: No Typology Code available for payment source | Admitting: Psychology

## 2022-02-12 ENCOUNTER — Encounter: Payer: Self-pay | Admitting: Family Medicine

## 2022-02-12 ENCOUNTER — Ambulatory Visit (INDEPENDENT_AMBULATORY_CARE_PROVIDER_SITE_OTHER): Payer: Self-pay | Admitting: Family Medicine

## 2022-02-12 VITALS — BP 122/68 | HR 58 | Temp 97.3°F | Ht 72.0 in | Wt 178.0 lb

## 2022-02-12 DIAGNOSIS — L209 Atopic dermatitis, unspecified: Secondary | ICD-10-CM

## 2022-02-12 DIAGNOSIS — T7840XA Allergy, unspecified, initial encounter: Secondary | ICD-10-CM

## 2022-02-12 MED ORDER — TRIAMCINOLONE ACETONIDE 0.025 % EX OINT: TOPICAL_OINTMENT | CUTANEOUS | 1 refills | Status: DC

## 2022-02-12 MED ORDER — METHYLPREDNISOLONE ACETATE 80 MG/ML IJ SUSP
80.0000 mg | Freq: Once | INTRAMUSCULAR | Status: AC
  Administered 2022-02-12: 80 mg via INTRAMUSCULAR

## 2022-02-12 NOTE — Progress Notes (Signed)
   Established Patient Office Visit  Subjective   Patient ID: Ivan Norris, male    DOB: 24-Feb-1991  Age: 31 y.o. MRN: [id number]  Chief Complaint  Patient presents with   Insect Bite    Bug bites x 1 week very itchy all over body.     HPI 2 to 3-week history of a fixed pruritic rash on his arms anterior chest, genitals buttocks and legs.  He has no history of atopic dermatitis that he is aware of.  He had recently changed to Dial soap.  He uses Tide detergent.    Review of Systems  Constitutional: Negative.   HENT: Negative.    Eyes:  Negative for blurred vision, discharge and redness.  Respiratory: Negative.  Negative for wheezing.   Cardiovascular: Negative.   Gastrointestinal:  Negative for abdominal pain.  Genitourinary: Negative.   Musculoskeletal: Negative.  Negative for myalgias.  Skin:  Positive for itching and rash.  Neurological:  Negative for tingling, loss of consciousness and weakness.  Endo/Heme/Allergies:  Negative for polydipsia.      Objective:     BP 122/68 (BP Location: Left Arm, Patient Position: Sitting, Cuff Size: Normal)   Pulse (!) 58   Temp (!) 97.3 F (36.3 C) (Temporal)   Ht 6' (1.829 m)   Wt 178 lb (80.7 kg)   SpO2 97%   BMI 24.14 kg/m    Physical Exam Constitutional:      General: He is not in acute distress.    Appearance: Normal appearance. He is not ill-appearing, toxic-appearing or diaphoretic.  HENT:     Head: Normocephalic and atraumatic.     Right Ear: External ear normal.     Left Ear: External ear normal.  Eyes:     General: No scleral icterus.       Right eye: No discharge.        Left eye: No discharge.     Extraocular Movements: Extraocular movements intact.     Conjunctiva/sclera: Conjunctivae normal.  Pulmonary:     Effort: Pulmonary effort is normal. No respiratory distress.  Skin:    General: Skin is warm and dry.       Neurological:     Mental Status: He is alert and oriented to person, place, and time.   Psychiatric:        Mood and Affect: Mood normal.        Behavior: Behavior normal.      No results found for any visits on 02/12/22.    The ASCVD Risk score (Arnett DK, et al., 2019) failed to calculate for the following reasons:   The 2019 ASCVD risk score is only valid for ages 45 to 39    Assessment & Plan:   Problem List Items Addressed This Visit   None Visit Diagnoses     Atopic dermatitis, unspecified type    -  Primary   Relevant Medications   methylPREDNISolone acetate (DEPO-MEDROL) injection 80 mg   triamcinolone (KENALOG) 0.025 % ointment   Allergic reaction, initial encounter       Relevant Medications   methylPREDNISolone acetate (DEPO-MEDROL) injection 80 mg   triamcinolone (KENALOG) 0.025 % ointment       Return Use Dove or [place] soaps. Apply Eucerin immediately after showering..  Return if needed.  Consider changing laundry detergents to hypoallergenic brand.  Switch to [place] or [place] soaps.  Apply a moisturizing lotion directly after showering.  Avoid harsh soaps.  Libby Maw, MD

## 2022-04-19 ENCOUNTER — Other Ambulatory Visit: Payer: Self-pay | Admitting: Family Medicine

## 2022-04-19 DIAGNOSIS — F332 Major depressive disorder, recurrent severe without psychotic features: Secondary | ICD-10-CM

## 2022-05-29 ENCOUNTER — Other Ambulatory Visit: Payer: Self-pay | Admitting: Family Medicine

## 2022-05-29 DIAGNOSIS — F332 Major depressive disorder, recurrent severe without psychotic features: Secondary | ICD-10-CM

## 2023-02-03 ENCOUNTER — Ambulatory Visit
Admission: EM | Admit: 2023-02-03 | Discharge: 2023-02-03 | Disposition: A | Discharge status: Home / Self Care | Payer: BC Managed Care – PPO | Attending: Physician Assistant | Admitting: Physician Assistant

## 2023-02-03 DIAGNOSIS — J069 Acute upper respiratory infection, unspecified: Secondary | ICD-10-CM

## 2023-02-03 DIAGNOSIS — U071 COVID-19: Secondary | ICD-10-CM | POA: Diagnosis not present

## 2023-02-03 DIAGNOSIS — J029 Acute pharyngitis, unspecified: Secondary | ICD-10-CM

## 2023-02-03 LAB — POCT RAPID STREP A (OFFICE): Rapid Strep A Screen: NEGATIVE

## 2023-02-03 NOTE — ED Triage Notes (Signed)
"  Sore throat that started over the weekend, maybe Friday, No other symptoms". No fever (known). No new/unexplained rash.

## 2023-02-04 LAB — SARS CORONAVIRUS 2 (TAT 6-24 HRS): SARS Coronavirus 2: POSITIVE — AB

## 2023-02-06 LAB — CULTURE, GROUP A STREP (THRC)

## 2023-02-17 NOTE — ED Provider Notes (Signed)
[facility]    CSN: [id number] Arrival date & time: 02/03/23  5366      History   Chief Complaint Chief Complaint  Patient presents with   Sore Throat    HPI Ivan Norris is a 32 y.o. male.   Patient here today for evaluation of sore throat that started over the weekend.  He has not had any other symptoms.  He denies any fever.  He has not any cough or congestion.  The history is provided by the patient.  Sore Throat Pertinent negatives include no abdominal pain and no shortness of breath.    History reviewed. No pertinent past medical history.  Patient Active Problem List   Diagnosis Date Noted   Healthcare maintenance 10/14/2021   AKI (acute kidney injury) (HCC) 10/14/2021   Severe episode of recurrent major depressive disorder, without psychotic features (HCC) 10/04/2021   LACTOSE INTOLERANCE 05/31/2010   Gastroesophageal reflux disease 05/31/2010   CONSTIPATION, CHRONIC 05/31/2010   FLATULENCE ERUCTATION AND GAS PAIN 05/31/2010   Constipation 05/31/2010   Lactose intolerance 05/31/2010    History reviewed. No pertinent surgical history.     Home Medications    Prior to Admission medications   Medication Sig Start Date End Date Taking? Authorizing Provider  Dextromethorphan-guaiFENesin [facility] DM PO) Take by mouth.   Yes [provider]  Phenylephrine-Aspirin (ALKA-SELTZER PLUS SINUS PO) Take by mouth.   Yes [provider]  FLUoxetine (PROZAC) 20 MG capsule Take 1 capsule by mouth once daily 05/29/22   Mliss Sax, MD  triamcinolone (KENALOG) 0.025 % ointment May apply to troublesome spots twice daily.  Not for face or private areas. 02/12/22   Mliss Sax, MD    Family History Family History  Problem Relation Age of Onset   Diabetes Mother    Diabetes Maternal Grandmother     Social History Social History   Tobacco Use   Smoking status: Never    Passive exposure: Current   Smokeless tobacco:  Never   Tobacco comments:    @ job.   Vaping Use   Vaping status: Never Used  Substance Use Topics   Alcohol use: Yes    Comment: Occassionally.   Drug use: Not Currently     Allergies   Patient has no known allergies.   Review of Systems Review of Systems  Constitutional:  Negative for chills and fever.  HENT:  Positive for sore throat. Negative for congestion and ear pain.   Eyes:  Negative for discharge and redness.  Respiratory:  Negative for cough and shortness of breath.   Gastrointestinal:  Negative for abdominal pain, nausea and vomiting.     Physical Exam Triage Vital Signs ED Triage Vitals  Encounter Vitals Group     BP 02/03/23 1036 111/78     Systolic BP Percentile --      Diastolic BP Percentile --      Pulse Rate 02/03/23 1036 67     Resp 02/03/23 1036 16     Temp 02/03/23 1036 98.5 F (36.9 C)     Temp Source 02/03/23 1036 Oral     SpO2 02/03/23 1036 98 %     Weight 02/03/23 1033 170 lb (77.1 kg)     Height 02/03/23 1033 6' (1.829 m)     Head Circumference --      Peak Flow --      Pain Score 02/03/23 1031 1     Pain Loc --  Pain Education --      Exclude from Growth Chart --    No data found.  Updated Vital Signs BP 111/78 (BP Location: Left Arm)   Pulse 67   Temp 98.5 F (36.9 C) (Oral)   Resp 16   Ht 6' (1.829 m)   Wt 170 lb (77.1 kg)   SpO2 98%   BMI 23.06 kg/m       Physical Exam Vitals and nursing note reviewed.  Constitutional:      General: He is not in acute distress.    Appearance: Normal appearance. He is not ill-appearing.  HENT:     Head: Normocephalic and atraumatic.     Nose: Nose normal. No congestion.     Mouth/Throat:     Mouth: Mucous membranes are moist.     Pharynx: Oropharynx is clear. No oropharyngeal exudate or posterior oropharyngeal erythema.  Eyes:     Conjunctiva/sclera: Conjunctivae normal.  Cardiovascular:     Rate and Rhythm: Normal rate and regular rhythm.     Heart sounds: Normal heart  sounds. No murmur heard. Pulmonary:     Effort: Pulmonary effort is normal. No respiratory distress.     Breath sounds: Normal breath sounds. No wheezing, rhonchi or rales.  Skin:    General: Skin is warm and dry.  Neurological:     Mental Status: He is alert.  Psychiatric:        Mood and Affect: Mood normal.        Thought Content: Thought content normal.      UC Treatments / Results  Labs (all labs ordered are listed, but only abnormal results are displayed) Labs Reviewed  SARS CORONAVIRUS 2 (TAT 6-24 HRS) - Abnormal; Notable for the following components:      Result Value   SARS Coronavirus 2 POSITIVE (*)    All other components within normal limits  CULTURE, GROUP A STREP [facility])  POCT RAPID STREP A (OFFICE)    EKG   Radiology No results found.  Procedures Procedures (including critical care time)  Medications Ordered in UC Medications - No data to display  Initial Impression / Assessment and Plan / UC Course  I have reviewed the triage vital signs and the nursing notes.  Pertinent labs & imaging results that were available during my care of the patient were reviewed by me and considered in my medical decision making (see chart for details).    Rapid strep screen negative in office.  Will order throat culture as well as COVID screening.  Will await results further recommendation.  Encouraged follow-up if no gradual improvement with any further concerns.  Final Clinical Impressions(s) / UC Diagnoses   Final diagnoses:  Viral upper respiratory tract infection  Acute pharyngitis, unspecified etiology   Discharge Instructions   None    ED Prescriptions   None    PDMP not reviewed this encounter.   Tomi Bamberger, PA-C 02/17/23 1727

## 2023-04-10 ENCOUNTER — Ambulatory Visit: Payer: Self-pay

## 2023-11-02 ENCOUNTER — Encounter (HOSPITAL_BASED_OUTPATIENT_CLINIC_OR_DEPARTMENT_OTHER): Payer: Self-pay

## 2023-11-02 ENCOUNTER — Emergency Department (HOSPITAL_BASED_OUTPATIENT_CLINIC_OR_DEPARTMENT_OTHER)
Admission: EM | Admit: 2023-11-02 | Discharge: 2023-11-02 | Disposition: A | Discharge status: Home / Self Care | Payer: Worker's Compensation | Attending: Student | Admitting: Student

## 2023-11-02 DIAGNOSIS — Y990 Civilian activity done for income or pay: Secondary | ICD-10-CM | POA: Diagnosis not present

## 2023-11-02 DIAGNOSIS — W228XXA Striking against or struck by other objects, initial encounter: Secondary | ICD-10-CM | POA: Diagnosis not present

## 2023-11-02 DIAGNOSIS — S0003XA Contusion of scalp, initial encounter: Secondary | ICD-10-CM | POA: Insufficient documentation

## 2023-11-02 DIAGNOSIS — S0990XA Unspecified injury of head, initial encounter: Secondary | ICD-10-CM

## 2023-11-02 NOTE — Discharge Instructions (Addendum)
 Evaluation today for your head injury was overall reassuring.  Please follow-up your PCP.  If you develop vomiting, loss of consciousness, weakness or numbness in your extremities, facial droop or slurred speech please return to the ED for further evaluation.  As we discussed, it is possible that you sustained a mild concussion.  Please treat conservatively at home which includes conservative hydration, rest and the low stim environment.  You can take Tylenol  and ibuprofen for your headache.

## 2023-11-02 NOTE — ED Provider Notes (Signed)
 [place] [facility] AT [facility] Provider Note   CSN: [id number] Arrival date & time: 11/02/23  1712     Patient presents with: No chief complaint on file.  HPI Ivan Norris is a 33 y.o. male presenting for head injury.  Occurred today around 2:30 PM.  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  He states he was in a hole and stepped up out of the hole and hit the top of his head on a stationary excavator scooper.  He reports an abrasion to the top of his head.  States he had a mild headache but since he has been here that headache has resolved.  Denies any visual disturbance, numbness or weakness in his extremities.  Denies use of blood thinners.  Denies nausea and vomiting.  Denies LOC.   HPI     Prior to Admission medications   Medication Sig Start Date End Date Taking? Authorizing Provider  Dextromethorphan-guaiFENesin (MUCINEX DM PO) Take by mouth.    [provider]  FLUoxetine  (PROZAC ) 20 MG capsule Take 1 capsule by mouth once daily 05/29/22   Berneta Elsie Sayre, MD  Phenylephrine-Aspirin (ALKA-SELTZER PLUS SINUS PO) Take by mouth.    [provider]  triamcinolone  (KENALOG ) 0.025 % ointment May apply to troublesome spots twice daily.  Not for face or private areas. 02/12/22   Berneta Elsie Sayre, MD    Allergies: Patient has no known allergies.    Review of Systems See HPI  Updated Vital Signs BP 135/87 (BP Location: Right Arm)   Pulse 89   Temp 98.7 F (37.1 C)   Resp 16   SpO2 99%    Physical Exam   Vitals:   11/02/23 1733  BP: 135/87  Pulse: 89  Resp: 16  Temp: 98.7 F (37.1 C)  SpO2: 99%    CONSTITUTIONAL:  well-appearing, NAD NEURO: GCS 15. Speech is goal oriented. No deficits appreciated to CN III-XII; symmetric eyebrow raise, no facial drooping, tongue midline. Patient has equal grip strength bilaterally with 5/5 strength against resistance in all major muscle groups bilaterally. Sensation to light  touch intact. Patient moves extremities without ataxia. Normal finger-nose-finger. Patient ambulatory with steady gait. Head: Small hematoma noted to the mid anterior scalp.  No raccoon eyes, Battle sign, rhinorrhea. EYES:  eyes equal and reactive ENT/NECK:  Supple, no stridor  CARDIO:  regular rate and rhythm, appears well-perfused  PULM:  No respiratory distress, CTAB GI/GU:  non-distended, MSK/SPINE:  No gross deformities, no edema, moves all extremities  SKIN:  no rash, atraumatic  *Additional and/or pertinent findings included in MDM below   (all labs ordered are listed, but only abnormal results are displayed) Labs Reviewed - No data to display  EKG: None  Radiology: No results found.   Procedures   Medications Ordered in the ED - No data to display                                  Medical Decision Making  33 year old well-appearing male presenting for head injury.  Exam notable for what appeared to be a small hematoma on the anterior mid scalp.  Considered skull fracture or traumatic head bleed but unlikely given how well he appears with a reassuring neuroexam.  Per [place] CT head injury guideline, CT is not indicated.  I do agree with this recommendation.  We discussed pertinent return precautions.  It is possible that he  sustained a mild concussion.  Advised supportive treatment at home.  Advised follow-up with PCP.  Discharged in good condition.     Final diagnoses:  Injury of head, initial encounter    ED Discharge Orders     None          Ellajane Stong K, PA-C 11/02/23 2037    Albertina Dixon, MD 11/03/23 1734

## 2023-11-02 NOTE — ED Triage Notes (Signed)
 Pt c/o head injury, states he hit head on excavator at work, getting out of a hole & hit my head on the bucket. States that his mom looked into his eyes & said he might have a concussion. Denies symptoms related, want to get checked anyway.

## 2024-04-11 ENCOUNTER — Encounter: Payer: Self-pay | Admitting: Emergency Medicine

## 2024-04-11 ENCOUNTER — Ambulatory Visit
Admission: EM | Admit: 2024-04-11 | Discharge: 2024-04-11 | Disposition: A | Discharge status: Home / Self Care | Attending: Family Medicine | Admitting: Family Medicine

## 2024-04-11 DIAGNOSIS — J029 Acute pharyngitis, unspecified: Secondary | ICD-10-CM | POA: Diagnosis not present

## 2024-04-11 LAB — POCT RAPID STREP A (OFFICE): Rapid Strep A Screen: NEGATIVE

## 2024-04-11 NOTE — Discharge Instructions (Addendum)
 You were seen today for sore throat.  Your rapid strep was negative.  This will be sent to the lab for further testing.  If anything is positive you will be notified for treatment.  In the mean time, this appears to be viral.  You may use tylenol  or motrin for pain, and warm salt water gargles.   Return if not improving or worsening by end of the week.

## 2024-04-11 NOTE — ED Triage Notes (Signed)
 Pt reports sore throat with difficulty swallowing x 4 days. Denies nasal congestion, cough, fever, chills, abd pain, and GI symptoms. Taking mucinex and benadryl with no relief. No sick contacts.

## 2024-04-11 NOTE — ED Provider Notes (Signed)
 [facility]    CSN: [id number] Arrival date & time: 04/11/24  1318      History   Chief Complaint Chief Complaint  Patient presents with   Sore Throat    HPI Ivan Norris is a 33 y.o. male.    Sore Throat  Patient is here for sore throat x 4 days. No runny nose, congestion.  No fevers/chills.  Mild cough.  No GI symptoms noted.  Taking otc meds without help.        History reviewed. No pertinent past medical history.  Patient Active Problem List   Diagnosis Date Noted   Healthcare maintenance 10/14/2021   AKI (acute kidney injury) 10/14/2021   Severe episode of recurrent major depressive disorder, without psychotic features (HCC) 10/04/2021   LACTOSE INTOLERANCE 05/31/2010   Gastroesophageal reflux disease 05/31/2010   CONSTIPATION, CHRONIC 05/31/2010   FLATULENCE ERUCTATION AND GAS PAIN 05/31/2010   Constipation 05/31/2010   Lactose intolerance 05/31/2010    History reviewed. No pertinent surgical history.     Home Medications    Prior to Admission medications  Medication Sig Start Date End Date Taking? Authorizing Provider  Dextromethorphan-guaiFENesin (MUCINEX DM PO) Take by mouth.   Yes [provider]  FLUoxetine  (PROZAC ) 20 MG capsule Take 1 capsule by mouth once daily Patient not taking: Reported on 04/11/2024 05/29/22   Berneta Elsie Sayre, MD  Phenylephrine-Aspirin (ALKA-SELTZER PLUS SINUS PO) Take by mouth. Patient not taking: Reported on 04/11/2024    [provider]  triamcinolone  (KENALOG ) 0.025 % ointment May apply to troublesome spots twice daily.  Not for face or private areas. Patient not taking: Reported on 04/11/2024 02/12/22   Berneta Elsie Sayre, MD    Family History Family History  Problem Relation Age of Onset   Diabetes Mother    Diabetes Maternal Grandmother     Social History Social History[1]   Allergies   Patient has no known allergies.   Review of Systems Review of  Systems  Constitutional: Negative.   HENT:  Positive for sore throat.   Respiratory: Negative.    Cardiovascular: Negative.   Gastrointestinal: Negative.   Musculoskeletal: Negative.      Physical Exam Triage Vital Signs ED Triage Vitals  Encounter Vitals Group     BP 04/11/24 1436 137/78     Girls Systolic BP Percentile --      Girls Diastolic BP Percentile --      Boys Systolic BP Percentile --      Boys Diastolic BP Percentile --      Pulse Rate 04/11/24 1436 73     Resp 04/11/24 1436 14     Temp 04/11/24 1436 99 F (37.2 C)     Temp Source 04/11/24 1436 Oral     SpO2 04/11/24 1436 95 %     Weight --      Height --      Head Circumference --      Peak Flow --      Pain Score 04/11/24 1434 2     Pain Loc --      Pain Education --      Exclude from Growth Chart --    No data found.  Updated Vital Signs BP 137/78 (BP Location: Left Arm)   Pulse 73   Temp 99 F (37.2 C) (Oral)   Resp 14   SpO2 95%   Visual Acuity Right Eye Distance:   Left Eye Distance:   Bilateral Distance:  Right Eye Near:   Left Eye Near:    Bilateral Near:     Physical Exam Constitutional:      General: He is not in acute distress.    Appearance: He is well-developed and normal weight. He is not ill-appearing or toxic-appearing.  HENT:     Nose: No congestion or rhinorrhea.     Mouth/Throat:     Mouth: Mucous membranes are moist.     Pharynx: Posterior oropharyngeal erythema present. No oropharyngeal exudate.     Tonsils: No tonsillar exudate. 2+ on the right. 2+ on the left.  Cardiovascular:     Rate and Rhythm: Normal rate and regular rhythm.     Heart sounds: Normal heart sounds.  Pulmonary:     Effort: Pulmonary effort is normal.  Musculoskeletal:     Cervical back: Normal range of motion and neck supple.  Lymphadenopathy:     Cervical: No cervical adenopathy.  Neurological:     General: No focal deficit present.     Mental Status: He is alert.  Psychiatric:         Mood and Affect: Mood normal.      UC Treatments / Results  Labs (all labs ordered are listed, but only abnormal results are displayed) Labs Reviewed  POCT RAPID STREP A (OFFICE) - Normal  CULTURE, GROUP A STREP [facility])    EKG   Radiology No results found.  Procedures Procedures (including critical care time)  Medications Ordered in UC Medications - No data to display  Initial Impression / Assessment and Plan / UC Course  I have reviewed the triage vital signs and the nursing notes.  Pertinent labs & imaging results that were available during my care of the patient were reviewed by me and considered in my medical decision making (see chart for details).   Final Clinical Impressions(s) / UC Diagnoses   Final diagnoses:  Sore throat  Viral pharyngitis     Discharge Instructions      You were seen today for sore throat.  Your rapid strep was negative.  This will be sent to the lab for further testing.  If anything is positive you will be notified for treatment.  In the mean time, this appears to be viral.  You may use tylenol  or motrin for pain, and warm salt water gargles.   Return if not improving or worsening by end of the week.     ED Prescriptions   None    PDMP not reviewed this encounter.     [1]  Social History Tobacco Use   Smoking status: Never    Passive exposure: Current   Smokeless tobacco: Never   Tobacco comments:    @ job.   Vaping Use   Vaping status: Never Used  Substance Use Topics   Alcohol use: Yes    Comment: Occassionally.   Drug use: Not Currently     Darral Longs, MD 04/11/24 1459

## 2024-04-14 ENCOUNTER — Ambulatory Visit (HOSPITAL_COMMUNITY): Payer: Self-pay

## 2024-04-14 LAB — CULTURE, GROUP A STREP (THRC)
